# Patient Record
Sex: Male | Born: 1970 | Race: White | Hispanic: No | Marital: Single | Smoking: Never smoker
Health system: Southern US, Community
[De-identification: ages and names within clinical notes are randomized; demographics above are authoritative.]

---

## 2008-11-23 ENCOUNTER — Encounter (INDEPENDENT_AMBULATORY_CARE_PROVIDER_SITE_OTHER): Payer: Self-pay | Admitting: General Surgery

## 2008-11-23 ENCOUNTER — Observation Stay (HOSPITAL_COMMUNITY): Admission: EM | Admit: 2008-11-23 | Discharge: 2008-11-24 | Payer: Self-pay | Admitting: Emergency Medicine

## 2010-07-04 IMAGING — CT CT PELVIS W/ CM
2 of 5 series · 17 of 46 positions shown, 19 images · IV contrast (APPLIED)
Comparison: None

CT ABDOMEN

CLINICAL DATA: Right lower quadrant pain.  Clinical suspicion for
appendicitis.

CT ABDOMEN AND PELVIS WITH CONTRAST
TECHNIQUE: Multidetector CT imaging of the abdomen and pelvis was
performed using the standard protocol following bolus
administration of intravenous contrast.
Contrast: 100 ml Pmnipaque-AYY and oral contrast

[Series 2: abd/pelv with 5.0 b31f st · axial · 0.71mm/px · z∈[-500,-96]mm · 14 of 91 slices shown, 16 images]
[im 5/91  soft-tissue]
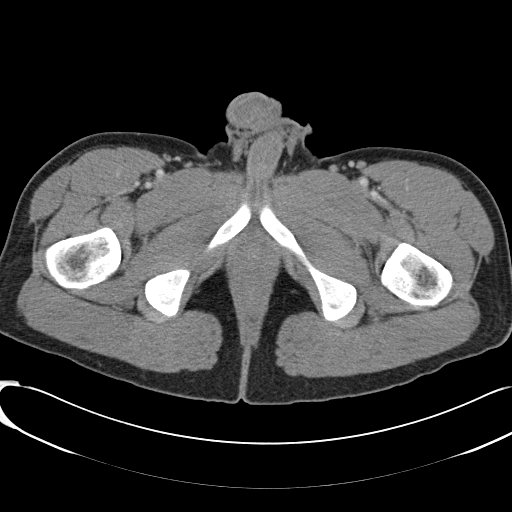
[im 5/91  bone]
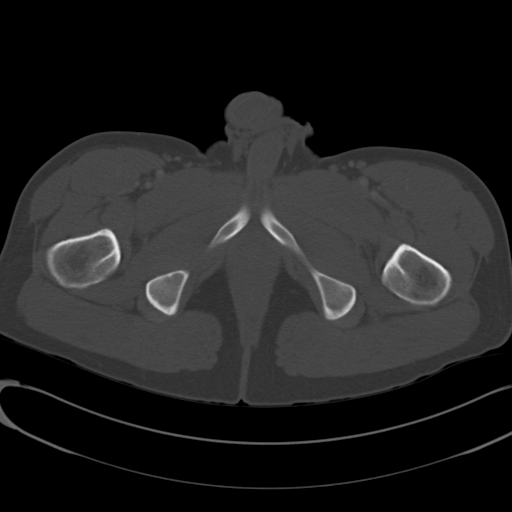
[im 14/91  soft-tissue]
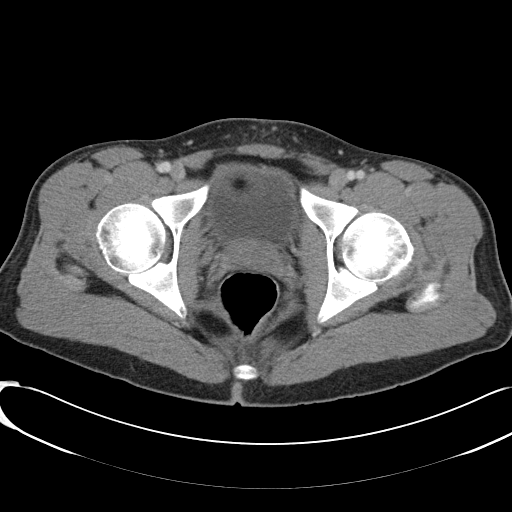
[im 19/91  soft-tissue]
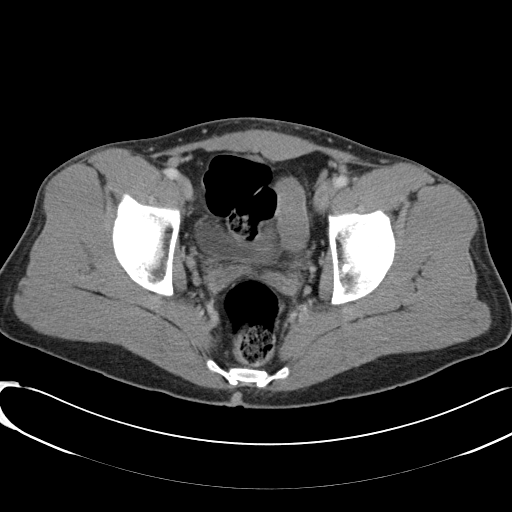
[im 23/91  soft-tissue]
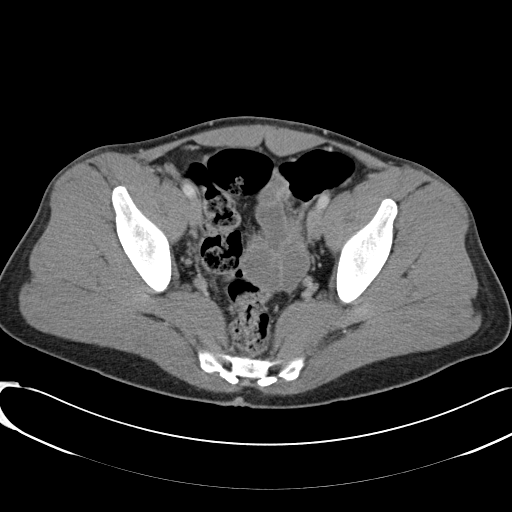
[im 32/91  soft-tissue]
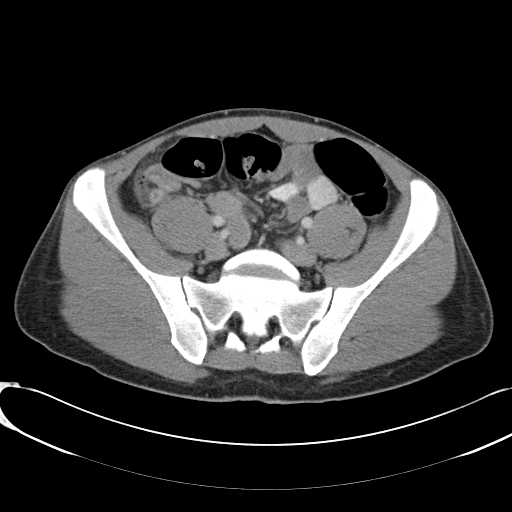
[im 37/91  soft-tissue]
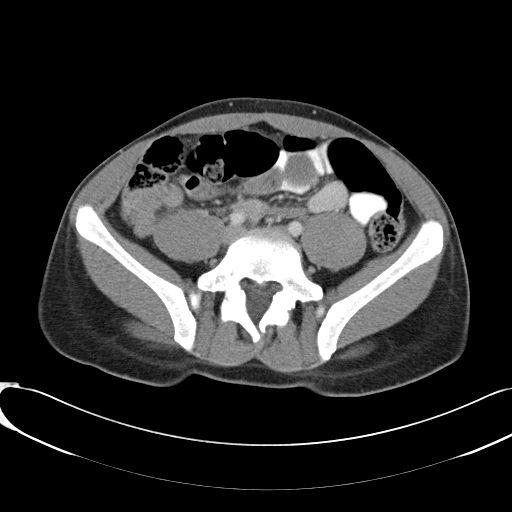
[im 41/91  soft-tissue]
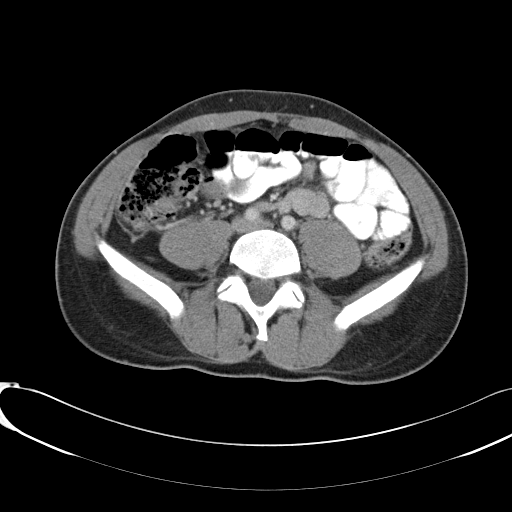
[im 50/91  soft-tissue]
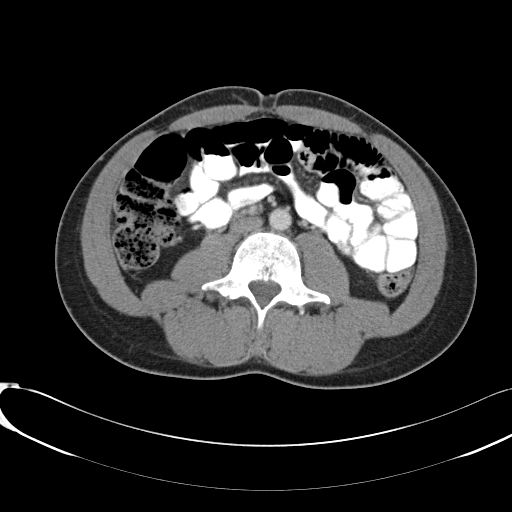
[im 55/91  soft-tissue]
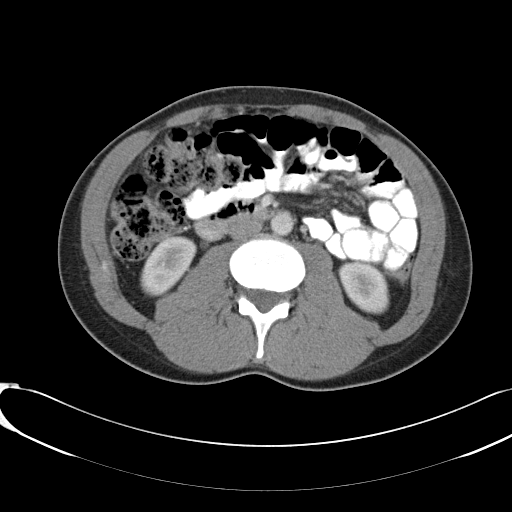
[im 55/91  bone]
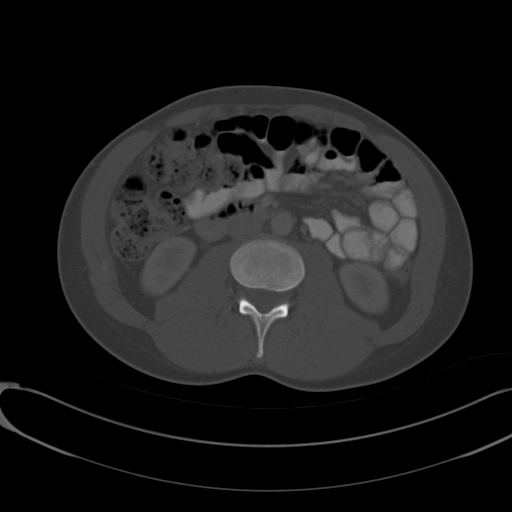
[im 59/91  soft-tissue]
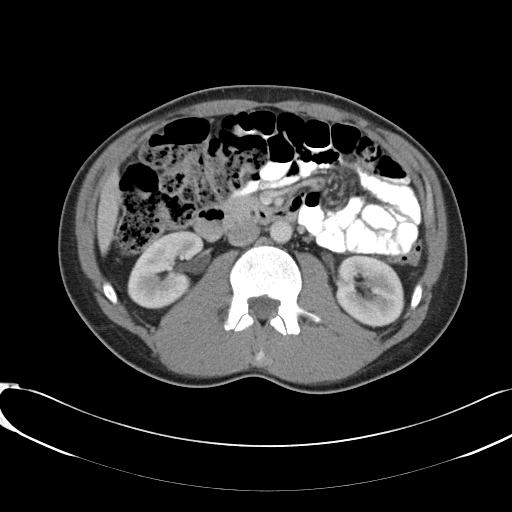
[im 68/91  soft-tissue]
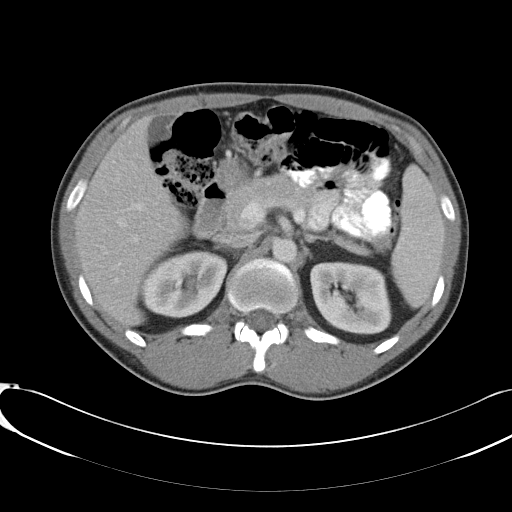
[im 73/91  soft-tissue]
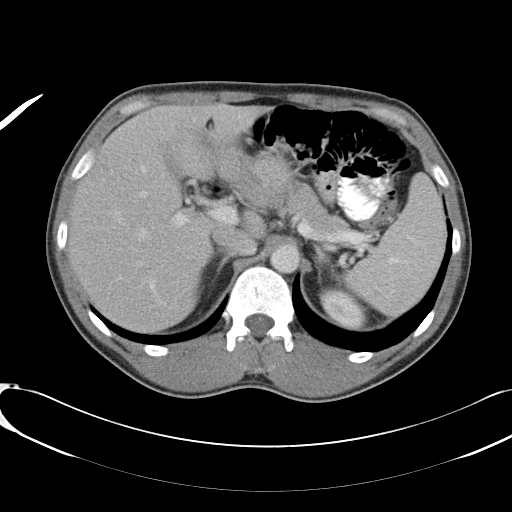
[im 77/91  soft-tissue]
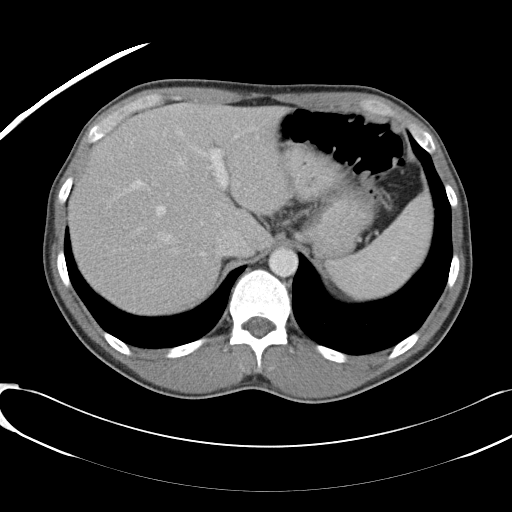
[im 86/91  soft-tissue]
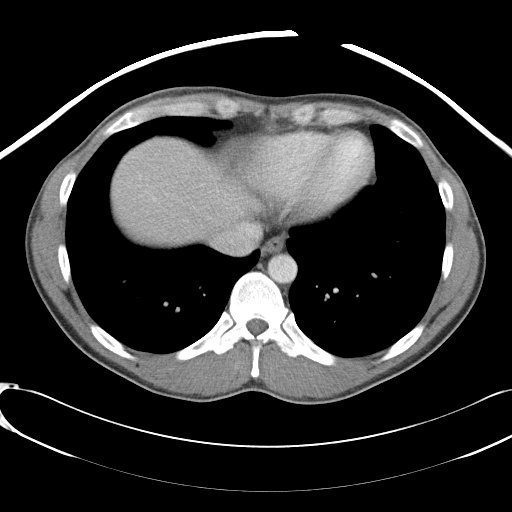

[Series 602: cor · coronal · 0.89mm/px · 3 of 70 slices shown]
[im 24/70  soft-tissue]
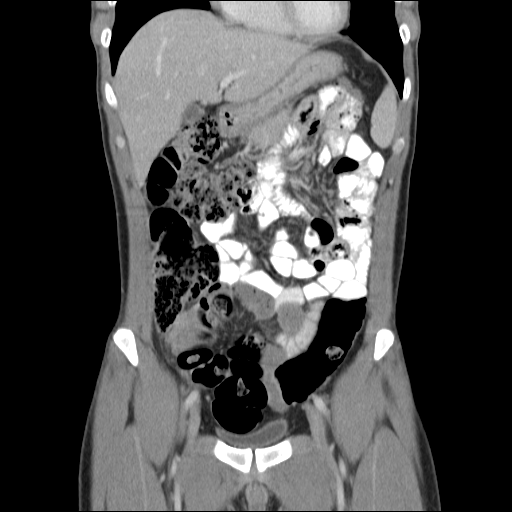
[im 31/70  soft-tissue]
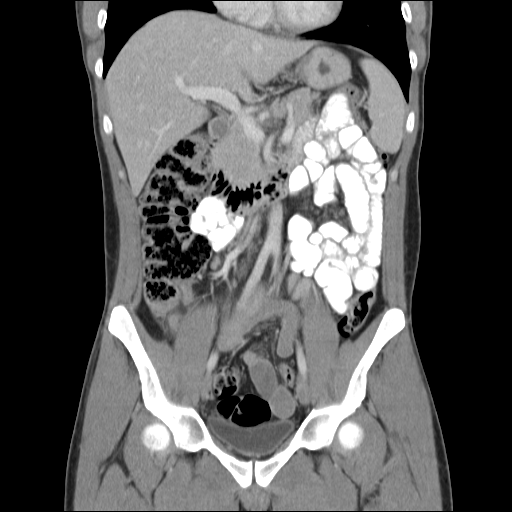
[im 39/70  soft-tissue]
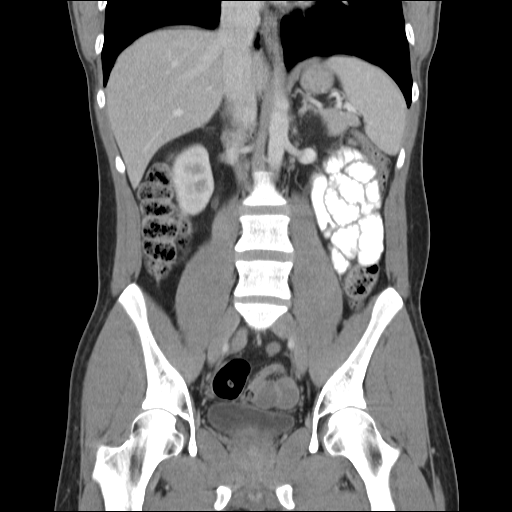

[17 of 46 positions shown; findings below may reference images not displayed]

FINDINGS: The abdominal parenchymal organs are normal in
appearance.  Gallbladder is unremarkable.  No evidence of
hydronephrosis.  No soft tissue masses are identified.  There is no
evidence of inflammatory process or abnormal fluid collections.
IMPRESSION: Negative abdomen CT.

CT PELVIS
FINDINGS: The appendix shows mild thickening enhancement, and
there is mild periappendiceal inflammatory changes.  This is
consistent with acute appendicitis

There is no evidence of abscess or other abnormal fluid
collections.  There is no evidence of pelvic soft tissue masses.
There is no evidence of bowel obstruction.
IMPRESSION: Findings consistent with acute appendicitis.  No evidence of
abscess or other complication.

## 2011-01-23 LAB — CBC
Platelets: 253 10*3/uL (ref 150–400)
RBC: 4.36 MIL/uL (ref 4.22–5.81)
WBC: 7.6 10*3/uL (ref 4.0–10.5)

## 2011-01-23 LAB — URINALYSIS, ROUTINE W REFLEX MICROSCOPIC
Bilirubin Urine: NEGATIVE
Hgb urine dipstick: NEGATIVE
Ketones, ur: NEGATIVE mg/dL
Nitrite: NEGATIVE
Specific Gravity, Urine: 1.022 (ref 1.005–1.030)
pH: 7 (ref 5.0–8.0)

## 2011-01-23 LAB — COMPREHENSIVE METABOLIC PANEL
ALT: 22 U/L (ref 0–53)
AST: 21 U/L (ref 0–37)
Albumin: 3.8 g/dL (ref 3.5–5.2)
CO2: 30 mEq/L (ref 19–32)
Chloride: 100 mEq/L (ref 96–112)
GFR calc Af Amer: >60 mL/min (ref >60)
GFR calc non Af Amer: >60 mL/min (ref >60)
Sodium: 135 mEq/L (ref 135–145)
Total Bilirubin: 1 mg/dL (ref 0.3–1.2)

## 2011-01-23 LAB — DIFFERENTIAL
Basophils Absolute: 0.1 10*3/uL (ref 0.0–0.1)
Eosinophils Absolute: 0.1 10*3/uL (ref 0.0–0.7)
Eosinophils Relative: 1 % (ref 0–5)
Lymphocytes Relative: 22 % (ref 12–46)
Lymphs Abs: 1.7 10*3/uL (ref 0.7–4.0)
Monocytes Absolute: 0.8 10*3/uL (ref 0.1–1.0)

## 2011-01-23 LAB — LIPASE, BLOOD: Lipase: 29 U/L (ref 11–59)

## 2011-02-20 NOTE — H&P (Signed)
NAMEZEIN, HELBING NO.:  [id number]   MEDICAL RECORD NO.:  [id number]          PATIENT TYPE:  INP   LOCATION:  5152                         FACILITY:  [facility]   PHYSICIAN:  Ollen Gross. Vernell Morgans, M.D. DATE OF BIRTH:  [birth year]   DATE OF ADMISSION:  11/22/2008  DATE OF DISCHARGE:                              HISTORY & PHYSICAL   Mr. Scullion is a 40 year old white male who began having some upper  abdominal pain on Sunday evening after dinner.  He took some Maalox  without any relief.  By Monday morning, the pain had moved down to his  right lower quadrant.  The pain did not get any better on Monday which  caused him to go to the doctor.  He was then sent for a CT scan which  showed an enlarged inflamed appendix, but no evidence of perforation.   He denies any fever, denies any nausea or vomiting, denies any diarrhea.  His other review of systems are unremarkable.   Past medical history is significant for exercise-induced asthma.   Past surgical history is none.   MEDICATIONS:  None.   Allergies are to PENICILLIN and LARIAM.   SOCIAL HISTORY:  He denies use of tobacco products.   Family history is noncontributory.   PHYSICAL EXAMINATION:  VITAL SIGNS:  His temperature is 98.2, pulse 71,  blood pressure 132/75.  GENERAL:  This is a well-developed, well-nourished white male in no  acute distress.  SKIN:  Warm and dry without jaundice.  HEENT:  Eyes are anicteric.  Extraocular movements are intact.  Pupils  are equal, round, and reactive to light.  Sclerae nonicteric  LUNGS:  Clear bilaterally with no use of accessory inspiratory muscles.  HEART:  Regular rate and rhythm with an impulse in the left chest.  ABDOMEN:  Soft with moderate right lower quadrant pain, but no  peritonitis.  No palpable mass or visceromegaly.  EXTREMITIES:  No cyanosis, clubbing, or edema.  Good strength in his  arms and legs.  PSYCHOLOGIC:  He is alert and oriented x3 with no evidence  of anxiety or  depression.   On review of his lab work, it was significant for normal white count.  On review of his CT, it did show an enlarged inflamed appendix  consistent with appendicitis.   ASSESSMENT AND PLAN:  This is a 40 year old white male with what appears  to be acute appendicitis.  Because of the risk of perforation of the  sepsis, I think he would benefit from having his appendix removed.  He  would also  like to have this done.  I have explained to him in detail the risks and  benefits of the operation to remove the appendix as well as some of the  technical aspects and he understands and wish to proceed.  We will  obtain some routine preoperative lab in preparation to have this  performed tonight.       Ollen Gross. Vernell Morgans, M.D.  Electronically Signed     PST/MEDQ  D:  11/23/2008  T:  11/23/2008  Job:  [id number]

## 2011-02-20 NOTE — Op Note (Signed)
NAME:  Taylor Figueroa, Taylor Figueroa NO.:  [id number]   MEDICAL RECORD NO.:  [id number]          PATIENT TYPE:  INP   LOCATION:  2550                         FACILITY:  [facility]   PHYSICIAN:  Ollen Gross. Vernell Morgans, M.D. DATE OF BIRTH:  12-05-1970   DATE OF PROCEDURE:  11/23/2008  DATE OF DISCHARGE:                               OPERATIVE REPORT   PREOPERATIVE DIAGNOSIS:  Acute appendicitis.   POSTOPERATIVE DIAGNOSIS:  Acute appendicitis.   PROCEDURE:  Laparoscopic appendectomy.   SURGEON:  Ollen Gross. Vernell Morgans, MD   ANESTHESIA:  General endotracheal.   PROCEDURE:  After informed consent was obtained, the patient was brought  to the operating room, placed in supine position on the operating table.  After adequate induction of general anesthesia, the patient's abdomen  was prepped with Betadine and draped in usual sterile manner.  The area  above and below the umbilicus was infiltrated with 0.25% Marcaine.  Small incision was made with 15 blade knife.  This incision was carried  down through the subcutaneous tissue bluntly with a hemostat and [employer]-  [employer] retractors until linea alba was identified.  The linea alba was  incised with a 15 blade knife.  Bleeding site was grasped with Kocher  clamps and elevated anteriorly.  The preperitoneal space was then probed  bluntly with the hemostat until the peritoneum was opened access was  gained to the abdominal cavity.  The 0 Vicryl pursestring stitch was  placed in the fascia around the opening.  A Hasson cannula was placed  through the opening and anchored in place with a previously placed  Vicryl purse-string stitch.  The abdomen was then insufflated with  carbon dioxide without difficulty.  The patient was placed in  Trendelenburg position and rotated with the right side up.  The  suprapubic region was then infiltrated with 0.25% Marcaine.  A small  incision was made with 50 blade knife and 10-mm port was placed bluntly  through this  incision in the abdominal cavity under direct vision.  A  site was chosen between the two for placement of 5-mm ports.  This area  was also infiltrated with 0.25% Marcaine.  A small incision was made  with a 15 blade knife and a 5-mm port was placed bluntly through this  incision into the abdominal cavity under direct vision.  The laparoscope  was then moved to the suprapubic port using a Glassman grasper and  harmonic scalpel.  The right lower quadrant was inspected, and an  enlarged and inflamed appendix was readily identified.  The appendix and  part of the cecum were mobilized by incising its retroperitoneal  attachment.  This was done sharply with the harmonic scalpel.  Once this  was accomplished, we were able to get the appendix better elevated.  The  mesoappendix was then also taken down sharply with a laparoscopic  harmonic scalpel.  Once this was accomplished, the base of the appendix  at its junction with cecum was readily identified and cleared of any  tissue.  Laparoscopic GI blue load 6-row stapler was then placed to the  Hasson  cannula and across the base of the appendix at its junction with  the cecum clamped and fired thereby dividing the base of the appendix  between the staple lines.  Laparoscopic bag was inserted through the  Hasson cannula.  The appendix was placed in the bag and the bag was  sealed.  The abdomen was then irrigated with copious amounts of saline.  The staple line had a small little bleeding ooze to it, which was  controlled with clips and at this point, the staple line looked  completely hemostatic and healthy.  No other abnormalities were noted on  inspection of the abdomen.  The bag with the appendix and Hasson cannula  were then removed through the infraumbilical port site without any  difficulty.  The fascial defect was closed with previously placed Vicryl  purse-string stitch as well as with another figure-of-eight 0 Vicryl  stitch.  The rest of  the ports were removed under direct vision.  The  gas was allowed to escape.  The skin incisions were all closed with  interrupted 4-0 Monocryl  subcuticular stitches and Dermabond dressings were applied.  The patient  tolerated the procedure well.  At the end of the case, all needle,  sponge, and instrument counts were correct.  The patient was then  awakened and taken to recovery room in stable condition.      Ollen Gross. Vernell Morgans, M.D.  Electronically Signed     PST/MEDQ  D:  11/23/2008  T:  11/23/2008  Job:  [id number]

## 2011-02-28 ENCOUNTER — Emergency Department (HOSPITAL_COMMUNITY)
Admission: EM | Admit: 2011-02-28 | Discharge: 2011-03-01 | Disposition: A | Discharge status: Home / Self Care | Payer: 59 | Attending: Emergency Medicine | Admitting: Emergency Medicine

## 2011-02-28 DIAGNOSIS — R51 Headache: Secondary | ICD-10-CM | POA: Insufficient documentation

## 2011-02-28 DIAGNOSIS — B9789 Other viral agents as the cause of diseases classified elsewhere: Secondary | ICD-10-CM | POA: Insufficient documentation

## 2011-02-28 DIAGNOSIS — R5381 Other malaise: Secondary | ICD-10-CM | POA: Insufficient documentation

## 2011-02-28 DIAGNOSIS — R110 Nausea: Secondary | ICD-10-CM | POA: Insufficient documentation

## 2011-02-28 DIAGNOSIS — R059 Cough, unspecified: Secondary | ICD-10-CM | POA: Insufficient documentation

## 2011-02-28 DIAGNOSIS — R5383 Other fatigue: Secondary | ICD-10-CM | POA: Insufficient documentation

## 2011-02-28 DIAGNOSIS — R05 Cough: Secondary | ICD-10-CM | POA: Insufficient documentation

## 2011-02-28 DIAGNOSIS — IMO0001 Reserved for inherently not codable concepts without codable children: Secondary | ICD-10-CM | POA: Insufficient documentation

## 2011-03-01 ENCOUNTER — Emergency Department (HOSPITAL_COMMUNITY): Payer: 59

## 2011-03-01 LAB — DIFFERENTIAL
Basophils Absolute: 0 10*3/uL (ref 0.0–0.1)
Eosinophils Relative: 0 % (ref 0–5)
Lymphocytes Relative: 20 % (ref 12–46)
Monocytes Absolute: 0.6 10*3/uL (ref 0.1–1.0)
Monocytes Relative: 14 % — ABNORMAL HIGH (ref 3–12)
Neutro Abs: 2.7 10*3/uL (ref 1.7–7.7)

## 2011-03-01 LAB — BASIC METABOLIC PANEL
BUN: 8 mg/dL (ref 6–23)
CO2: 29 mEq/L (ref 19–32)
Calcium: 9.4 mg/dL (ref 8.4–10.5)
Creatinine, Ser: 1 mg/dL (ref 0.4–1.5)
GFR calc non Af Amer: >60 mL/min (ref >60)
Glucose, Bld: 113 mg/dL — ABNORMAL HIGH (ref 70–99)

## 2011-03-01 LAB — CBC
HCT: 41.3 % (ref 39.0–52.0)
Hemoglobin: 14.3 g/dL (ref 13.0–17.0)
MCH: 30.5 pg (ref 26.0–34.0)
MCHC: 34.6 g/dL (ref 30.0–36.0)
MCV: 88.1 fL (ref 78.0–100.0)
RDW: 12.2 % (ref 11.5–15.5)

## 2011-03-01 LAB — URINALYSIS, ROUTINE W REFLEX MICROSCOPIC
Bilirubin Urine: NEGATIVE
Hgb urine dipstick: NEGATIVE
Ketones, ur: NEGATIVE mg/dL
Nitrite: NEGATIVE
Protein, ur: NEGATIVE mg/dL
Specific Gravity, Urine: 1.007 (ref 1.005–1.030)
Urobilinogen, UA: 0.2 mg/dL (ref 0.0–1.0)

## 2012-10-10 IMAGING — CR DG CHEST 2V
2 series · 2 of 2 positions shown · non-contrast
Comparison: None.

CLINICAL DATA: Cough, fever, headache, body aches and weakness.

CHEST - 2 VIEW

[w chest pa]
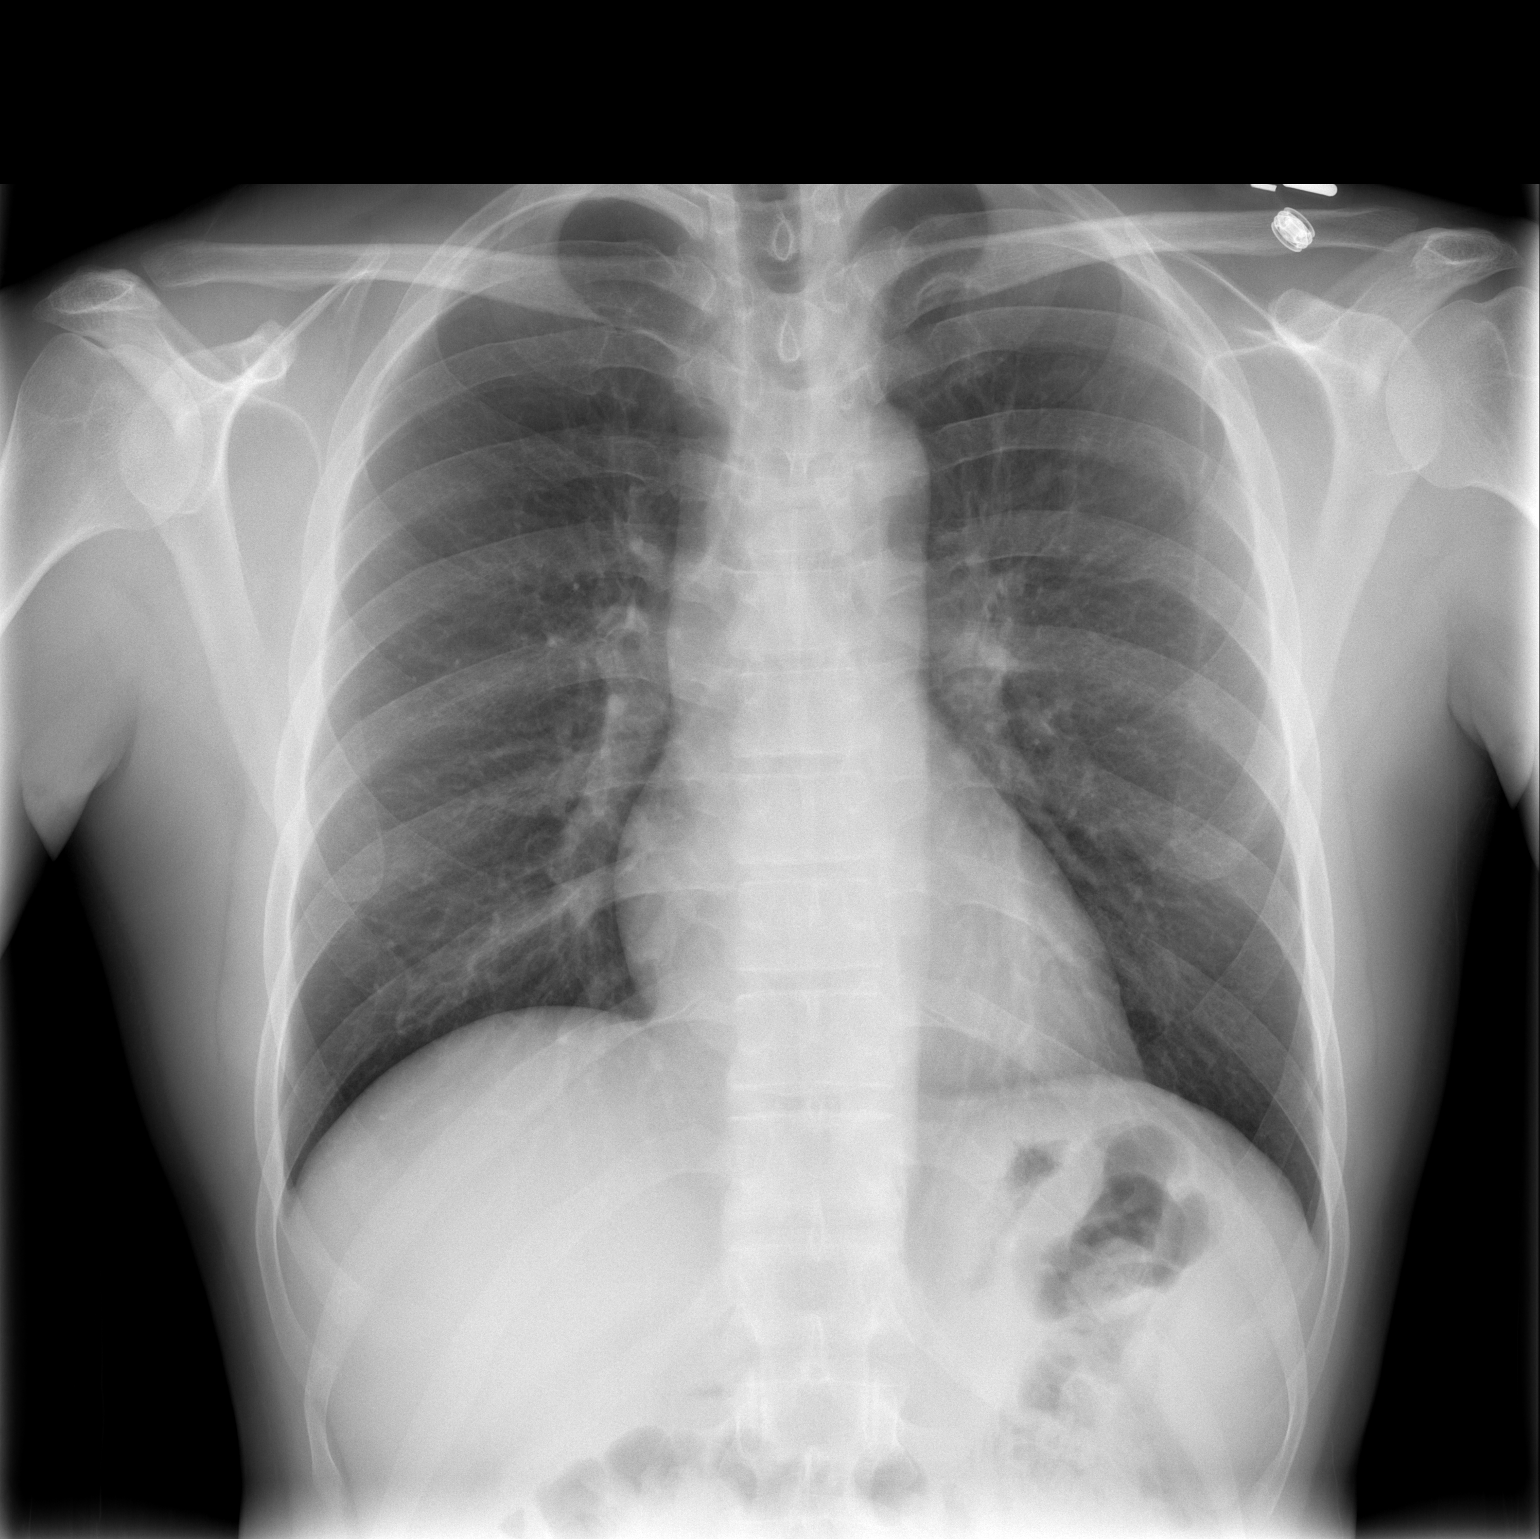

[w chest lat]
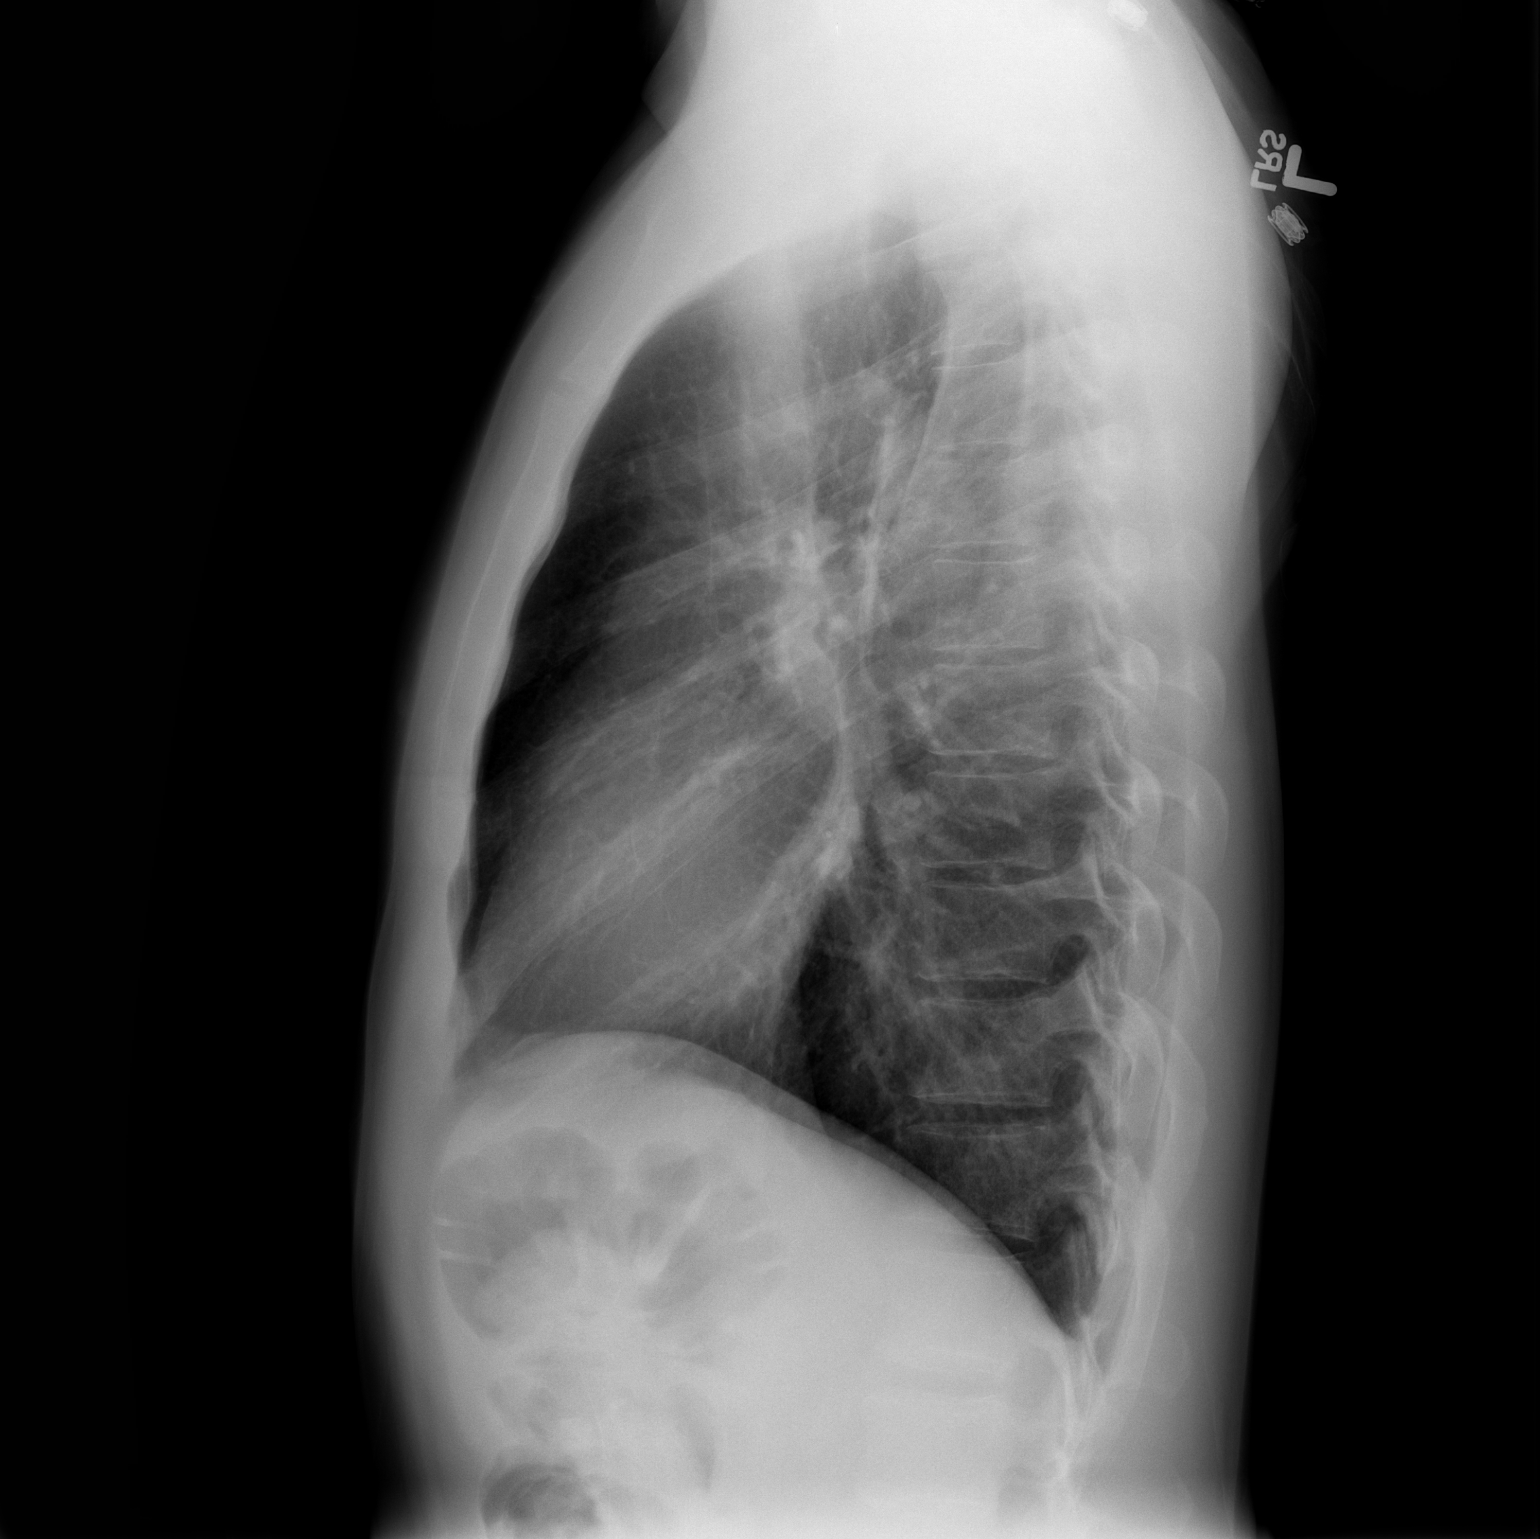

[2 of 2 positions shown; findings below may reference images not displayed]

FINDINGS: The lungs are well-aerated and clear.  There is no
evidence of focal opacification, pleural effusion or pneumothorax.

The heart is normal in size; the mediastinal contour is within
normal limits.  No acute osseous abnormalities are seen.
IMPRESSION: No acute cardiopulmonary process seen.

## 2016-03-12 DIAGNOSIS — Z0000 Encounter for general adult medical examination without abnormal findings: Secondary | ICD-10-CM | POA: Diagnosis not present

## 2016-03-19 DIAGNOSIS — Z0000 Encounter for general adult medical examination without abnormal findings: Secondary | ICD-10-CM | POA: Diagnosis not present

## 2016-03-19 DIAGNOSIS — Z6823 Body mass index (BMI) 23.0-23.9, adult: Secondary | ICD-10-CM | POA: Diagnosis not present

## 2016-03-19 DIAGNOSIS — M25519 Pain in unspecified shoulder: Secondary | ICD-10-CM | POA: Diagnosis not present

## 2016-03-19 DIAGNOSIS — M545 Low back pain: Secondary | ICD-10-CM | POA: Diagnosis not present

## 2016-03-19 DIAGNOSIS — Z125 Encounter for screening for malignant neoplasm of prostate: Secondary | ICD-10-CM | POA: Diagnosis not present

## 2017-02-22 DIAGNOSIS — H10413 Chronic giant papillary conjunctivitis, bilateral: Secondary | ICD-10-CM | POA: Diagnosis not present

## 2017-02-22 DIAGNOSIS — H01024 Squamous blepharitis left upper eyelid: Secondary | ICD-10-CM | POA: Diagnosis not present

## 2017-02-22 DIAGNOSIS — H01021 Squamous blepharitis right upper eyelid: Secondary | ICD-10-CM | POA: Diagnosis not present

## 2017-02-22 DIAGNOSIS — H01022 Squamous blepharitis right lower eyelid: Secondary | ICD-10-CM | POA: Diagnosis not present

## 2017-03-29 DIAGNOSIS — Z125 Encounter for screening for malignant neoplasm of prostate: Secondary | ICD-10-CM | POA: Diagnosis not present

## 2017-03-29 DIAGNOSIS — Z0000 Encounter for general adult medical examination without abnormal findings: Secondary | ICD-10-CM | POA: Diagnosis not present

## 2017-04-05 DIAGNOSIS — Z6823 Body mass index (BMI) 23.0-23.9, adult: Secondary | ICD-10-CM | POA: Diagnosis not present

## 2017-04-05 DIAGNOSIS — Z125 Encounter for screening for malignant neoplasm of prostate: Secondary | ICD-10-CM | POA: Diagnosis not present

## 2017-04-05 DIAGNOSIS — M25519 Pain in unspecified shoulder: Secondary | ICD-10-CM | POA: Diagnosis not present

## 2017-04-05 DIAGNOSIS — Z23 Encounter for immunization: Secondary | ICD-10-CM | POA: Diagnosis not present

## 2017-04-05 DIAGNOSIS — Z1389 Encounter for screening for other disorder: Secondary | ICD-10-CM | POA: Diagnosis not present

## 2017-04-05 DIAGNOSIS — Z8042 Family history of malignant neoplasm of prostate: Secondary | ICD-10-CM | POA: Diagnosis not present

## 2017-04-05 DIAGNOSIS — Z0000 Encounter for general adult medical examination without abnormal findings: Secondary | ICD-10-CM | POA: Diagnosis not present

## 2017-04-08 DIAGNOSIS — Z1212 Encounter for screening for malignant neoplasm of rectum: Secondary | ICD-10-CM | POA: Diagnosis not present

## 2018-04-04 DIAGNOSIS — Z125 Encounter for screening for malignant neoplasm of prostate: Secondary | ICD-10-CM | POA: Diagnosis not present

## 2018-04-04 DIAGNOSIS — Z0000 Encounter for general adult medical examination without abnormal findings: Secondary | ICD-10-CM | POA: Diagnosis not present

## 2018-04-09 DIAGNOSIS — K648 Other hemorrhoids: Secondary | ICD-10-CM | POA: Diagnosis not present

## 2018-04-09 DIAGNOSIS — D72819 Decreased white blood cell count, unspecified: Secondary | ICD-10-CM | POA: Diagnosis not present

## 2018-04-09 DIAGNOSIS — Z125 Encounter for screening for malignant neoplasm of prostate: Secondary | ICD-10-CM | POA: Diagnosis not present

## 2018-04-09 DIAGNOSIS — Z0000 Encounter for general adult medical examination without abnormal findings: Secondary | ICD-10-CM | POA: Diagnosis not present

## 2018-04-09 DIAGNOSIS — D229 Melanocytic nevi, unspecified: Secondary | ICD-10-CM | POA: Diagnosis not present

## 2018-04-14 DIAGNOSIS — Z1212 Encounter for screening for malignant neoplasm of rectum: Secondary | ICD-10-CM | POA: Diagnosis not present

## 2019-06-04 DIAGNOSIS — B349 Viral infection, unspecified: Secondary | ICD-10-CM | POA: Diagnosis not present

## 2019-06-04 DIAGNOSIS — R509 Fever, unspecified: Secondary | ICD-10-CM | POA: Diagnosis not present

## 2019-10-03 ENCOUNTER — Emergency Department (HOSPITAL_COMMUNITY)
Admission: EM | Admit: 2019-10-03 | Discharge: 2019-10-03 | Disposition: A | Discharge status: Home / Self Care | Payer: BLUE CROSS/BLUE SHIELD | Attending: Emergency Medicine | Admitting: Emergency Medicine

## 2019-10-03 ENCOUNTER — Other Ambulatory Visit: Payer: Self-pay

## 2019-10-03 ENCOUNTER — Encounter (HOSPITAL_COMMUNITY): Payer: Self-pay | Admitting: Emergency Medicine

## 2019-10-03 DIAGNOSIS — R519 Headache, unspecified: Secondary | ICD-10-CM | POA: Diagnosis not present

## 2019-10-03 MED ORDER — SODIUM CHLORIDE 0.9 % IV BOLUS
1000.0000 mL | Freq: Once | INTRAVENOUS | Status: AC
  Administered 2019-10-03: 1000 mL via INTRAVENOUS

## 2019-10-03 MED ORDER — METOCLOPRAMIDE HCL 5 MG/ML IJ SOLN
10.0000 mg | Freq: Once | INTRAMUSCULAR | Status: AC
  Administered 2019-10-03: 10 mg via INTRAVENOUS
  Filled 2019-10-03: qty 2

## 2019-10-03 MED ORDER — DEXAMETHASONE SODIUM PHOSPHATE 10 MG/ML IJ SOLN
10.0000 mg | Freq: Once | INTRAMUSCULAR | Status: AC
  Administered 2019-10-03: 10 mg via INTRAVENOUS
  Filled 2019-10-03: qty 1

## 2019-10-03 MED ORDER — PROMETHAZINE HCL 25 MG PO TABS: 25.0000 mg | ORAL_TABLET | Freq: Four times a day (QID) | ORAL | 0 refills | Status: DC | PRN

## 2019-10-03 MED ORDER — DIPHENHYDRAMINE HCL 50 MG/ML IJ SOLN
50.0000 mg | Freq: Once | INTRAMUSCULAR | Status: AC
  Administered 2019-10-03: 50 mg via INTRAVENOUS
  Filled 2019-10-03: qty 1

## 2019-10-03 NOTE — ED Provider Notes (Signed)
[facility]   CSN: [id number] Arrival date & time: 10/03/19  1259     History Chief Complaint  Patient presents with  . ? COVID  . Headache  . Nausea    Taylor Figueroa is a 48 y.o. male with no known past medical history presents emergency department today with chief complaint of headache x5 days. He is reporting associated nausea without emesis despite taking zofran. He states the headache has worsened since onset. He has been taking tylenol and Naproxen with little symptom relief. He states the pain is located throughout entire head and feels like throbbing sensation. He denies any associated neck pain. He rates headache 8/10 in severity. He states this feels like headaches he has had in the past but they usually do not last this long.  Denies fever, syncope, head trauma, photophobia, phonophobia, UL throbbing, N/V, visual changes, stiff neck, rash, or "thunderclap" onset.  He denies any sick contacts and denies any contact with anyone known positive for covid 19.   History reviewed. No pertinent past medical history.  There are no problems to display for this patient.   History reviewed. No pertinent surgical history.     No family history on file.  Social History   Tobacco Use  . Smoking status: Never Smoker  . Smokeless tobacco: Never Used  Substance Use Topics  . Alcohol use: Not Currently  . Drug use: Never    Home Medications Prior to Admission medications   Medication Sig Start Date End Date Taking? Authorizing Provider  promethazine (PHENERGAN) 25 MG tablet Take 1 tablet (25 mg total) by mouth every 6 (six) hours as needed for nausea or vomiting. 10/03/19   Cashawn Yanko, Caroleen HammanKaitlyn E, PA-C    Allergies    Penicillins  Review of Systems   Review of Systems  All other systems are reviewed and are negative for acute change except as noted in the HPI.   Physical Exam Updated Vital Signs BP 124/72 (BP  Location: Right Arm)   Pulse (!) 56   Temp 98.8 F (37.1 C) (Oral)   Resp 16   SpO2 100%   Physical Exam Vitals and nursing note reviewed.  Constitutional:      General: He is not in acute distress.    Appearance: He is not ill-appearing.  HENT:     Head: Normocephalic and atraumatic.     Comments: No sinus or temporal tenderness.    Right Ear: Tympanic membrane and external ear normal.     Left Ear: Tympanic membrane and external ear normal.     Nose: Nose normal.     Mouth/Throat:     Mouth: Mucous membranes are moist.     Pharynx: Oropharynx is clear.  Eyes:     General: No scleral icterus.       Right eye: No discharge.        Left eye: No discharge.     Extraocular Movements: Extraocular movements intact.     Conjunctiva/sclera: Conjunctivae normal.     Pupils: Pupils are equal, round, and reactive to light.  Neck:     Vascular: No JVD.     Comments: Full ROM intact without spinous process TTP. No bony stepoffs or deformities, no paraspinous muscle TTP or muscle spasms. No rigidity or meningeal signs. No bruising, erythema, or swelling.  Cardiovascular:     Rate and Rhythm: Normal rate and regular rhythm.     Pulses: Normal pulses.  Radial pulses are 2+ on the right side and 2+ on the left side.     Heart sounds: Normal heart sounds.     Comments: 65 during exam Pulmonary:     Comments: Lungs clear to auscultation in all fields. Symmetric chest rise. No wheezing, rales, or rhonchi. Abdominal:     Tenderness: There is no right CVA tenderness or left CVA tenderness.     Comments: Abdomen is soft, non-distended, and non-tender in all quadrants. No rigidity, no guarding. No peritoneal signs.  Musculoskeletal:        General: Normal range of motion.     Cervical back: Normal range of motion.  Skin:    General: Skin is warm and dry.     Capillary Refill: Capillary refill takes less than 2 seconds.  Neurological:     Mental Status: He is oriented to person,  place, and time.     GCS: GCS eye subscore is 4. GCS verbal subscore is 5. GCS motor subscore is 6.     Comments: Mental Status:  Alert, oriented, thought content appropriate, able to give a coherent history. Speech fluent without evidence of aphasia. Able to follow 2 step commands without difficulty.  Cranial Nerves:  II:  Peripheral visual fields grossly normal, pupils equal, round, reactive to light III,IV, VI: ptosis not present, extra-ocular motions intact bilaterally  V,VII: smile symmetric, facial light touch sensation equal VIII: hearing grossly normal to voice  X: uvula elevates symmetrically  XI: bilateral shoulder shrug symmetric and strong XII: midline tongue extension without fassiculations Motor:  Normal tone. 5/5 in upper and lower extremities bilaterally including strong and equal grip strength and dorsiflexion/plantar flexion Sensory: Pinprick and light touch normal in all extremities.  Deep Tendon Reflexes: 2+ and symmetric in the biceps and patella Cerebellar: normal finger-to-nose with bilateral upper extremities Gait: normal gait and balance CV: distal pulses palpable throughout     Psychiatric:        Mood and Affect: Affect is tearful.        Behavior: Behavior normal.       ED Results / Procedures / Treatments   Labs (all labs ordered are listed, but only abnormal results are displayed) Labs Reviewed - No data to display  EKG None  Radiology No results found.  Procedures Procedures (including critical care time)  Medications Ordered in ED Medications  sodium chloride 0.9 % bolus 1,000 mL (1,000 mLs Intravenous New Bag/Given 10/03/19 1630)  dexamethasone (DECADRON) injection 10 mg (10 mg Intravenous Given 10/03/19 1626)  metoCLOPramide (REGLAN) injection 10 mg (10 mg Intravenous Given 10/03/19 1626)  diphenhydrAMINE (BENADRYL) injection 50 mg (50 mg Intravenous Given 10/03/19 1626)    ED Course  I have reviewed the triage vital signs and the  nursing notes.  Pertinent labs & imaging results that were available during my care of the patient were reviewed by me and considered in my medical decision making (see chart for details).    MDM Rules/Calculators/A&P                      Pt HA treated with IV Reglan, IV benadryl, IV decadron, IVF and improved while in ED.  Presentation is like pts typical HA and non concerning for [facility], ICH, Meningitis, or temporal arteritis. Pt is afebrile with no focal neuro deficits, nuchal rigidity, or change in vision.  Patient is tolerating p.o. intake while in the emergency department.  Pt is to follow up with PCP to discuss  prophylactic medication.  Patient given prescription for Phenergan for nausea as Zofran failed to relieve nausea.  Pt verbalizes understanding and is agreeable with plan to dc.    Portions of this note were generated with Scientist, clinical (histocompatibility and immunogenetics). Dictation errors may occur despite best attempts at proofreading.  Final Clinical Impression(s) / ED Diagnoses Final diagnoses:  Acute nonintractable headache, unspecified headache type    Rx / DC Orders ED Discharge Orders         Ordered    promethazine (PHENERGAN) 25 MG tablet  Every 6 hours PRN     10/03/19 1725           Sherene Sires, PA-C 10/03/19 1755    Virgina Norfolk, DO 10/04/19 0119

## 2019-10-03 NOTE — Discharge Instructions (Addendum)
Today you were evaluated at the emergency department for a headache.  -Please follow CDC guidelines for COVID quarantine. It is accessible on the Internet at http://www.wolf.info/  1. Medications: continue usual home medications 2. Treatment: rest, drink plenty of fluids, if headache persists take 858mmg ibuprofen with caffeine 3. Follow Up: Please followup with your primary doctor in 3 days and neurology within 1 week for discussion of your diagnoses and further evaluation after today's visit; if you do not have a primary care doctor use the resource guide provided to find one; Please return to the ER for double vision, speech difficulty, gait disturbance, persistent vomiting or other concerns.  Headache:  You are having a headache. No specific cause was found today for your headache. It may have been a migraine or other cause of headache. Stress, anxiety, fatigue, and depression are common triggers for headaches. Your headache today does not appear to be life-threatening or require hospitalization, but often the exact cause of headaches is not determined in the emergency department. Therefore, followup with your doctor is very important to find out what may have caused your headache, and whether or not you need any further diagnostic testing or treatment. Sometimes headaches can appear benign but then more serious symptoms can develop which should prompt an immediate reevaluation by your doctor or the emergency department.  Hydration: Have a goal of about a half liter of water every couple hours to stay well hydrated.   Sleep: Please be sure to get plenty of sleep with a goal of 8 hours per night. Having a regular bed time and bedtime routine can help with this.  Screens: Reduce the amount of time you are in front of screens.  Take about a 5-10-minute break every hour or every couple hours to give your eyes rest.  Do not use screens in dark rooms.  Glasses with a blue light filter may also help reduce eye  fatigue.  Stress: Take steps to reduce stress as much as possible.   Seek immediate medical attention if:  You develop possible problems with medications prescribed. The medications don't resolve your headache, if it recurs, or if you have multiple episodes of vomiting or can't take fluids by mouth You have a change from the usual headache. If you developed a sudden severe headache or confusion, become poorly responsive or faint, developed a fever above 100.4 or problems breathing, have a change in speech, vision, swallowing or understanding, or developed new weakness, numbness, tingling, incoordination or have a seizure.

## 2019-10-03 NOTE — ED Triage Notes (Signed)
C/o nausea, headache, and chills since last night.  States he had a headache approx 5 days ago that resolved.

## 2021-09-26 ENCOUNTER — Other Ambulatory Visit: Payer: Self-pay | Admitting: Internal Medicine

## 2021-09-26 DIAGNOSIS — Z0000 Encounter for general adult medical examination without abnormal findings: Secondary | ICD-10-CM

## 2022-10-16 DIAGNOSIS — D72819 Decreased white blood cell count, unspecified: Secondary | ICD-10-CM | POA: Diagnosis not present

## 2022-10-16 DIAGNOSIS — E789 Disorder of lipoprotein metabolism, unspecified: Secondary | ICD-10-CM | POA: Diagnosis not present

## 2022-10-16 DIAGNOSIS — Z125 Encounter for screening for malignant neoplasm of prostate: Secondary | ICD-10-CM | POA: Diagnosis not present

## 2022-10-16 DIAGNOSIS — M810 Age-related osteoporosis without current pathological fracture: Secondary | ICD-10-CM | POA: Diagnosis not present

## 2022-10-16 DIAGNOSIS — E559 Vitamin D deficiency, unspecified: Secondary | ICD-10-CM | POA: Diagnosis not present

## 2022-10-31 ENCOUNTER — Other Ambulatory Visit: Payer: Self-pay | Admitting: Internal Medicine

## 2022-10-31 DIAGNOSIS — Z0000 Encounter for general adult medical examination without abnormal findings: Secondary | ICD-10-CM

## 2022-10-31 DIAGNOSIS — R82998 Other abnormal findings in urine: Secondary | ICD-10-CM | POA: Diagnosis not present

## 2022-11-29 DIAGNOSIS — D225 Melanocytic nevi of trunk: Secondary | ICD-10-CM | POA: Diagnosis not present

## 2022-11-29 DIAGNOSIS — L821 Other seborrheic keratosis: Secondary | ICD-10-CM | POA: Diagnosis not present

## 2022-11-29 DIAGNOSIS — L720 Epidermal cyst: Secondary | ICD-10-CM | POA: Diagnosis not present

## 2022-11-29 DIAGNOSIS — D2371 Other benign neoplasm of skin of right lower limb, including hip: Secondary | ICD-10-CM | POA: Diagnosis not present

## 2022-12-06 ENCOUNTER — Ambulatory Visit
Admission: RE | Admit: 2022-12-06 | Discharge: 2022-12-06 | Disposition: A | Discharge status: Home / Self Care | Payer: No Typology Code available for payment source | Source: Ambulatory Visit | Attending: Internal Medicine | Admitting: Internal Medicine

## 2022-12-06 DIAGNOSIS — Z0000 Encounter for general adult medical examination without abnormal findings: Secondary | ICD-10-CM

## 2023-06-24 DIAGNOSIS — Z1212 Encounter for screening for malignant neoplasm of rectum: Secondary | ICD-10-CM | POA: Diagnosis not present

## 2023-06-24 DIAGNOSIS — Z1211 Encounter for screening for malignant neoplasm of colon: Secondary | ICD-10-CM | POA: Diagnosis not present

## 2023-11-05 DIAGNOSIS — Z125 Encounter for screening for malignant neoplasm of prostate: Secondary | ICD-10-CM | POA: Diagnosis not present

## 2023-11-05 DIAGNOSIS — E559 Vitamin D deficiency, unspecified: Secondary | ICD-10-CM | POA: Diagnosis not present

## 2023-11-05 DIAGNOSIS — E871 Hypo-osmolality and hyponatremia: Secondary | ICD-10-CM | POA: Diagnosis not present

## 2023-11-05 DIAGNOSIS — E789 Disorder of lipoprotein metabolism, unspecified: Secondary | ICD-10-CM | POA: Diagnosis not present

## 2023-11-13 DIAGNOSIS — Z1331 Encounter for screening for depression: Secondary | ICD-10-CM | POA: Diagnosis not present

## 2023-11-13 DIAGNOSIS — R82998 Other abnormal findings in urine: Secondary | ICD-10-CM | POA: Diagnosis not present

## 2023-11-13 DIAGNOSIS — Z1339 Encounter for screening examination for other mental health and behavioral disorders: Secondary | ICD-10-CM | POA: Diagnosis not present

## 2023-11-13 DIAGNOSIS — Z0000 Encounter for general adult medical examination without abnormal findings: Secondary | ICD-10-CM | POA: Diagnosis not present

## 2023-11-15 ENCOUNTER — Other Ambulatory Visit: Payer: Self-pay | Admitting: Internal Medicine

## 2023-11-15 DIAGNOSIS — R911 Solitary pulmonary nodule: Secondary | ICD-10-CM

## 2023-11-22 ENCOUNTER — Encounter: Payer: Self-pay | Admitting: Internal Medicine

## 2023-11-27 ENCOUNTER — Inpatient Hospital Stay: Admission: RE | Admit: 2023-11-27 | Payer: Self-pay | Source: Ambulatory Visit

## 2023-12-31 DIAGNOSIS — R918 Other nonspecific abnormal finding of lung field: Secondary | ICD-10-CM | POA: Diagnosis not present

## 2024-03-07 ENCOUNTER — Encounter: Payer: Self-pay | Admitting: Orthopedic Surgery

## 2024-03-07 ENCOUNTER — Emergency Department

## 2024-03-07 ENCOUNTER — Emergency Department
Admission: EM | Admit: 2024-03-07 | Discharge: 2024-03-07 | Disposition: A | Discharge status: Home / Self Care | Attending: Emergency Medicine | Admitting: Emergency Medicine

## 2024-03-07 ENCOUNTER — Other Ambulatory Visit: Payer: Self-pay

## 2024-03-07 DIAGNOSIS — E860 Dehydration: Secondary | ICD-10-CM | POA: Diagnosis not present

## 2024-03-07 DIAGNOSIS — R197 Diarrhea, unspecified: Secondary | ICD-10-CM

## 2024-03-07 DIAGNOSIS — R112 Nausea with vomiting, unspecified: Secondary | ICD-10-CM | POA: Insufficient documentation

## 2024-03-07 DIAGNOSIS — E871 Hypo-osmolality and hyponatremia: Secondary | ICD-10-CM

## 2024-03-07 DIAGNOSIS — R519 Headache, unspecified: Secondary | ICD-10-CM

## 2024-03-07 DIAGNOSIS — R1011 Right upper quadrant pain: Secondary | ICD-10-CM | POA: Diagnosis not present

## 2024-03-07 LAB — COMPREHENSIVE METABOLIC PANEL WITH GFR
ALT: 23 U/L (ref 0–44)
AST: 25 U/L (ref 15–41)
Albumin: 4.6 g/dL (ref 3.5–5.0)
Alkaline Phosphatase: 61 U/L (ref 38–126)
Anion gap: 11 (ref 5–15)
BUN: 10 mg/dL (ref 6–20)
CO2: 22 mmol/L (ref 22–32)
Calcium: 9.2 mg/dL (ref 8.9–10.3)
Chloride: 94 mmol/L — ABNORMAL LOW (ref 98–111)
Creatinine, Ser: 0.76 mg/dL (ref 0.61–1.24)
GFR, Estimated: >60 mL/min (ref >60)
Glucose, Bld: 106 mg/dL — ABNORMAL HIGH (ref 70–99)
Potassium: 4.1 mmol/L (ref 3.5–5.1)
Sodium: 127 mmol/L — ABNORMAL LOW (ref 135–145)
Total Bilirubin: 1.5 mg/dL — ABNORMAL HIGH (ref 0.0–1.2)
Total Protein: 7.6 g/dL (ref 6.5–8.1)

## 2024-03-07 LAB — URINALYSIS, ROUTINE W REFLEX MICROSCOPIC
Bacteria, UA: NONE SEEN
Bilirubin Urine: NEGATIVE
Glucose, UA: NEGATIVE mg/dL
Hgb urine dipstick: NEGATIVE
Ketones, ur: 80 mg/dL — AB
Leukocytes,Ua: NEGATIVE
Nitrite: NEGATIVE
Protein, ur: 30 mg/dL — AB
Specific Gravity, Urine: 1.024 (ref 1.005–1.030)
Squamous Epithelial / HPF: 0 /HPF (ref 0–5)
pH: 6 (ref 5.0–8.0)

## 2024-03-07 LAB — CBC
HCT: 40.8 % (ref 39.0–52.0)
Hemoglobin: 14.8 g/dL (ref 13.0–17.0)
MCH: 31.1 pg (ref 26.0–34.0)
MCHC: 36.3 g/dL — ABNORMAL HIGH (ref 30.0–36.0)
MCV: 85.7 fL (ref 80.0–100.0)
Platelets: 265 10*3/uL (ref 150–400)
RBC: 4.76 MIL/uL (ref 4.22–5.81)
RDW: 11.6 % (ref 11.5–15.5)
WBC: 3.5 10*3/uL — ABNORMAL LOW (ref 4.0–10.5)
nRBC: 0 % (ref 0.0–0.2)

## 2024-03-07 LAB — RESP PANEL BY RT-PCR (RSV, FLU A&B, COVID)  RVPGX2
Influenza A by PCR: NEGATIVE
Influenza B by PCR: NEGATIVE
Resp Syncytial Virus by PCR: NEGATIVE
SARS Coronavirus 2 by RT PCR: NEGATIVE

## 2024-03-07 LAB — LIPASE, BLOOD: Lipase: 38 U/L (ref 11–51)

## 2024-03-07 MED ORDER — DEXAMETHASONE SODIUM PHOSPHATE 10 MG/ML IJ SOLN
10.0000 mg | Freq: Once | INTRAMUSCULAR | Status: AC
  Administered 2024-03-07: 10 mg via INTRAVENOUS
  Filled 2024-03-07: qty 1

## 2024-03-07 MED ORDER — PROCHLORPERAZINE EDISYLATE 10 MG/2ML IJ SOLN
10.0000 mg | Freq: Once | INTRAMUSCULAR | Status: AC
  Administered 2024-03-07: 10 mg via INTRAVENOUS
  Filled 2024-03-07: qty 2

## 2024-03-07 MED ORDER — MORPHINE SULFATE (PF) 4 MG/ML IV SOLN
4.0000 mg | Freq: Once | INTRAVENOUS | Status: AC
  Administered 2024-03-07: 4 mg via INTRAVENOUS
  Filled 2024-03-07: qty 1

## 2024-03-07 MED ORDER — SODIUM CHLORIDE 0.9 % IV BOLUS
1000.0000 mL | Freq: Once | INTRAVENOUS | Status: AC
  Administered 2024-03-07: 1000 mL via INTRAVENOUS

## 2024-03-07 MED ORDER — SODIUM CHLORIDE 0.9 % IV SOLN
12.5000 mg | Freq: Once | INTRAVENOUS | Status: AC
  Administered 2024-03-07: 12.5 mg via INTRAVENOUS
  Filled 2024-03-07: qty 12.5

## 2024-03-07 MED ORDER — KETOROLAC TROMETHAMINE 15 MG/ML IJ SOLN
15.0000 mg | Freq: Once | INTRAMUSCULAR | Status: AC
  Administered 2024-03-07: 15 mg via INTRAVENOUS
  Filled 2024-03-07: qty 1

## 2024-03-07 MED ORDER — ONDANSETRON HCL 4 MG/2ML IJ SOLN
4.0000 mg | Freq: Once | INTRAMUSCULAR | Status: AC
  Administered 2024-03-07: 4 mg via INTRAVENOUS
  Filled 2024-03-07: qty 2

## 2024-03-07 MED ORDER — DIPHENHYDRAMINE HCL 50 MG/ML IJ SOLN
25.0000 mg | Freq: Once | INTRAMUSCULAR | Status: AC
  Administered 2024-03-07: 25 mg via INTRAVENOUS
  Filled 2024-03-07: qty 1

## 2024-03-07 MED ORDER — PROMETHAZINE HCL 12.5 MG PO TABS
12.5000 mg | ORAL_TABLET | Freq: Four times a day (QID) | ORAL | 0 refills | Status: AC | PRN
Start: 2024-03-07 — End: ?

## 2024-03-07 MED ORDER — SODIUM CHLORIDE 0.9 % IV SOLN: 12.5000 mg | Freq: Four times a day (QID) | INTRAVENOUS | Status: DC | PRN

## 2024-03-07 MED ORDER — METOCLOPRAMIDE HCL 5 MG/ML IJ SOLN
10.0000 mg | Freq: Once | INTRAMUSCULAR | Status: AC
  Administered 2024-03-07: 10 mg via INTRAVENOUS
  Filled 2024-03-07: qty 2

## 2024-03-07 NOTE — ED Notes (Signed)
 Pt ambulated to toilet and back to room with assistance of his wife.

## 2024-03-07 NOTE — ED Triage Notes (Signed)
 Pt comes in via pov with complaints of severe headache nausea/ vomiting since Sunday. Pt was taking tylenol and ibuprofen to keep his headache under control.  Last night the headache and dizziness and nausea got worse again. Pt has had these symptoms previously when he had covid. Pt is alert and oriented x4 with no signs of acute distress at this time.

## 2024-03-07 NOTE — Discharge Instructions (Addendum)
 Your exam, labs, CT scans today reviewed.  You have some dehydration as well as some hyponatremia.  You should select an follow-up a local primary care provider for routine medical care.  Return to the ED if needed.  Please go to the following website to schedule new (and existing) patient appointments:   [email or web address]   The following is a list of primary care offices in the area who are accepting new patients at this time.  Please reach out to one of them directly and let them know you would like to schedule an appointment to follow up on an Emergency Department visit, and/or to establish a new primary care provider (PCP).  There are likely other primary care clinics in the are who are accepting new patients, but this is an excellent place to start:  [facility] Lead physician: Dr Aden Agreste [address] [place], [place] [address] [phone]  [facility] Lead Physician: Dr Arleen Lacer [address], [place], [place] [address] [phone]  [facility]  Lead Physician: Dr Terre Ferri [address] [place], [place] [address] [phone]  [facility] Lead Physician: Dr Audrie Blind [address], [place], [place] [address] [phone]  [facility] Lead Physician: Dr Janna Melter [address] [place], [place], [place] [address] [phone]

## 2024-03-07 NOTE — ED Provider Notes (Signed)
 [facility] Provider Note   CSN: [id number] Arrival date & time: 03/07/24  1232     History  Chief Complaint  Patient presents with   Emesis    Taylor Figueroa is a 53 y.o. male.  Presents to the emergency department for evaluation of headache nausea vomiting and diarrhea.  His symptoms began 6 days ago, symptoms were severe but then improved.  At the last few days his symptoms have returned.  Patient denies any fevers.  He has been taking some Tylenol and ibuprofen.  He has not had much to eat or drink over the last few days due to severe nausea.  He describes severe generalized headache.  No vision changes.  No weakness.  No neck pain.  No abdominal pain.  No vertigo.  HPI     Home Medications Prior to Admission medications   Medication Sig Start Date End Date Taking? Authorizing Provider  promethazine  (PHENERGAN ) 25 MG tablet Take 1 tablet (25 mg total) by mouth every 6 (six) hours as needed for nausea or vomiting. 10/03/19   Walisiewicz, Kaitlyn E, PA-C      Allergies    Penicillins    Review of Systems   Review of Systems  Physical Exam Updated Vital Signs BP 133/82 (BP Location: Left Arm)   Pulse 62   Temp 98.5 F (36.9 C) (Oral)   Resp 18   Ht 5' 8 (1.727 m)   Wt 70.3 kg   SpO2 100%   BMI 23.57 kg/m  Physical Exam Constitutional:      Appearance: Normal appearance. He is well-developed. He is not ill-appearing.     Comments: Ambulatory with no antalgic gait.  No assistive devices.  HENT:     Head: Normocephalic and atraumatic.     Right Ear: Tympanic membrane, ear canal and external ear normal.     Left Ear: Tympanic membrane, ear canal and external ear normal.     Mouth/Throat:     Mouth: Mucous membranes are moist.     Pharynx: Oropharynx is clear. No oropharyngeal exudate.  Eyes:     Extraocular Movements: Extraocular movements intact.     Conjunctiva/sclera: Conjunctivae normal.     Pupils: Pupils are  equal, round, and reactive to light.  Cardiovascular:     Rate and Rhythm: Normal rate and regular rhythm.  Pulmonary:     Effort: Pulmonary effort is normal. No respiratory distress.     Breath sounds: Normal breath sounds.  Abdominal:     General: There is no distension.     Tenderness: There is no abdominal tenderness. There is no right CVA tenderness, left CVA tenderness or guarding.  Musculoskeletal:        General: Normal range of motion.     Cervical back: Normal range of motion and neck supple. No rigidity or tenderness.     Comments: No tremors.  Skin:    General: Skin is warm.     Findings: No rash.  Neurological:     General: No focal deficit present.     Mental Status: He is alert and oriented to person, place, and time. Mental status is at baseline.     Cranial Nerves: No cranial nerve deficit.     Motor: No weakness.     Coordination: Coordination normal.     Gait: Gait normal.  Psychiatric:        Mood and Affect: Mood normal.        Behavior: Behavior  normal.        Thought Content: Thought content normal.        Judgment: Judgment normal.     Comments: No signs of confusion     ED Results / Procedures / Treatments   Labs (all labs ordered are listed, but only abnormal results are displayed) Labs Reviewed  COMPREHENSIVE METABOLIC PANEL WITH GFR - Abnormal; Notable for the following components:      Result Value   Sodium 127 (*)    Chloride 94 (*)    Glucose, Bld 106 (*)    Total Bilirubin 1.5 (*)    All other components within normal limits  CBC - Abnormal; Notable for the following components:   WBC 3.5 (*)    MCHC 36.3 (*)    All other components within normal limits  URINALYSIS, ROUTINE W REFLEX MICROSCOPIC - Abnormal; Notable for the following components:   Color, Urine YELLOW (*)    APPearance CLEAR (*)    Ketones, ur 80 (*)    Protein, ur 30 (*)    All other components within normal limits  RESP PANEL BY RT-PCR (RSV, FLU A&B, COVID)  RVPGX2   LIPASE, BLOOD    EKG None  Radiology No results found.  Procedures Procedures    Medications Ordered in ED Medications  sodium chloride  0.9 % bolus 1,000 mL (has no administration in time range)  ondansetron  (ZOFRAN ) injection 4 mg (has no administration in time range)    ED Course/ Medical Decision Making/ A&P                                 Medical Decision Making Amount and/or Complexity of Data Reviewed Labs: ordered. Radiology: ordered.  Risk Prescription drug management.   53 year old male with 5 days of a severe headache, nausea, vomiting.  Has not had much to eat over the last few days.  Denies any abdominal pain, chest pain or shortness of breath.  No cough congestion or runny nose.  Initial blood work showed patient to be hyponatremic and hypochloremic, urinalysis showed no signs of infection but significant amount of ketones present.  Patient was started on IV fluids and given several medications for nausea which did not help much.  Phenergan  was later given which seemed to improve nausea significantly.  He was given medications for headache such as Toradol  and morphine  as well as dexamethasone  which seemed to improve headache by 50%.  Patient noted to have a slightly elevated bilirubin and on second exam of his abdomen he did have some slight tenderness in the right upper quadrant and ultrasound obtained showing normal right upper quadrant with no gallstones or wall thickening.  After continued IV fluids abdominal pain resolved.  2000 - patient much improved with nausea and vomiting.  Still having a slight headache but overall much improved.  Repeat physical exam and neuroexam normal.  No dizziness or lightheadedness.  Patient with no vomiting but continued nausea.  Will give continued IV fluids and give second dose of 12.5 mg of Phenergan  IV as well as second dose of Toradol  50 mg.  Anticipate after completing fluids and second round of Phenergan  and Toradol  patient  should be well enough to return home.  Will continue to monitor.  At this time care will be handed to [facility].    Final Clinical Impression(s) / ED Diagnoses Final diagnoses:  Acute nonintractable headache, unspecified headache type  Nausea  vomiting and diarrhea    Rx / DC Orders ED Discharge Orders     None         Charlene Debby JAYSON DEVONNA 03/31/24 EASTER Viviann Pastor, MD 04/01/24 [phone]

## 2024-03-07 NOTE — ED Notes (Signed)
 52 yom with a c/c of headache, nausea, vomiting, and some diarrhea since this past Sunday. The pt advised his symptoms have been coming and going. The pt is alert and oriented x 4. The pt is slightly pale, dry, and warm. The pt was ambulatory to the room.

## 2024-03-07 NOTE — ED Notes (Addendum)
 First nurse note: To ED from [facility] for HA, nausea, dizziness with moving, and vomiting since last 2 days. States had similar episode 1 week ago which resolved. No speech problems, no numbness or tingling. No photosensitivity. No hx of migraines or vertigo.

## 2024-03-07 NOTE — ED Notes (Signed)
 Patient transported to CT

## 2024-03-18 ENCOUNTER — Other Ambulatory Visit: Payer: Self-pay | Admitting: Family Medicine

## 2024-03-18 DIAGNOSIS — R519 Headache, unspecified: Secondary | ICD-10-CM

## 2024-03-20 ENCOUNTER — Other Ambulatory Visit: Payer: Self-pay | Admitting: Family Medicine

## 2024-03-20 ENCOUNTER — Encounter: Payer: Self-pay | Admitting: Family Medicine

## 2024-03-20 DIAGNOSIS — R519 Headache, unspecified: Secondary | ICD-10-CM

## 2024-04-01 ENCOUNTER — Ambulatory Visit
Admission: RE | Admit: 2024-04-01 | Discharge: 2024-04-01 | Disposition: A | Discharge status: Home / Self Care | Source: Ambulatory Visit | Attending: Family Medicine | Admitting: Family Medicine

## 2024-04-01 DIAGNOSIS — R519 Headache, unspecified: Secondary | ICD-10-CM

## 2024-04-01 MED ORDER — GADOPICLENOL 0.5 MMOL/ML IV SOLN
7.5000 mL | Freq: Once | INTRAVENOUS | Status: AC | PRN
  Administered 2024-04-01: 7.5 mL via INTRAVENOUS

## 2024-04-25 ENCOUNTER — Other Ambulatory Visit
